# Patient Record
Sex: Male | Born: 1955 | Race: White | Hispanic: No | Marital: Married
Health system: Southern US, Community
[De-identification: ages and names within clinical notes are randomized; demographics above are authoritative.]

---

## 2011-02-12 ENCOUNTER — Emergency Department: Payer: Self-pay | Admitting: Unknown Physician Specialty

## 2011-04-05 ENCOUNTER — Inpatient Hospital Stay: Payer: Self-pay | Admitting: General Practice

## 2013-12-16 ENCOUNTER — Inpatient Hospital Stay: Payer: Self-pay | Admitting: Family Medicine

## 2013-12-16 LAB — CBC WITH DIFFERENTIAL/PLATELET
BASOS ABS: 0.1 10*3/uL (ref 0.0–0.1)
BASOS PCT: 0.5 %
EOS ABS: 0.3 10*3/uL (ref 0.0–0.7)
Eosinophil %: 1.8 %
HCT: 41.3 % (ref 40.0–52.0)
HGB: 14.1 g/dL (ref 13.0–18.0)
LYMPHS ABS: 0.8 10*3/uL — AB (ref 1.0–3.6)
Lymphocyte %: 5.2 %
MCH: 30 pg (ref 26.0–34.0)
MCHC: 34.1 g/dL (ref 32.0–36.0)
MCV: 88 fL (ref 80–100)
MONO ABS: 1.1 x10 3/mm — AB (ref 0.2–1.0)
MONOS PCT: 6.7 %
NEUTROS ABS: 13.8 10*3/uL — AB (ref 1.4–6.5)
Neutrophil %: 85.8 %
Platelet: 451 10*3/uL — ABNORMAL HIGH (ref 150–440)
RBC: 4.69 10*6/uL (ref 4.40–5.90)
RDW: 14 % (ref 11.5–14.5)
WBC: 16.1 10*3/uL — AB (ref 3.8–10.6)

## 2013-12-16 LAB — URINALYSIS, COMPLETE
BACTERIA: NONE SEEN
BILIRUBIN, UR: NEGATIVE
BLOOD: NEGATIVE
Glucose,UR: NEGATIVE mg/dL (ref 0–75)
LEUKOCYTE ESTERASE: NEGATIVE
NITRITE: NEGATIVE
Ph: 7 (ref 4.5–8.0)
Protein: NEGATIVE
RBC,UR: 1 /HPF (ref 0–5)
Specific Gravity: 1.013 (ref 1.003–1.030)
Squamous Epithelial: NONE SEEN
WBC UR: 1 /HPF (ref 0–5)

## 2013-12-16 LAB — COMPREHENSIVE METABOLIC PANEL
ALT: 13 U/L (ref 12–78)
Albumin: 2.4 g/dL — ABNORMAL LOW (ref 3.4–5.0)
Alkaline Phosphatase: 74 U/L
Anion Gap: 6 — ABNORMAL LOW (ref 7–16)
BILIRUBIN TOTAL: 0.3 mg/dL (ref 0.2–1.0)
BUN: 6 mg/dL — AB (ref 7–18)
CO2: 27 mmol/L (ref 21–32)
Calcium, Total: 9.1 mg/dL (ref 8.5–10.1)
Chloride: 100 mmol/L (ref 98–107)
Creatinine: 0.79 mg/dL (ref 0.60–1.30)
EGFR (Non-African Amer.): >60
Glucose: 153 mg/dL — ABNORMAL HIGH (ref 65–99)
Osmolality: 267 (ref 275–301)
Potassium: 4.6 mmol/L (ref 3.5–5.1)
SGOT(AST): 14 U/L — ABNORMAL LOW (ref 15–37)
Sodium: 133 mmol/L — ABNORMAL LOW (ref 136–145)
Total Protein: 7.1 g/dL (ref 6.4–8.2)

## 2013-12-17 LAB — BASIC METABOLIC PANEL
Anion Gap: 6 — ABNORMAL LOW (ref 7–16)
BUN: 6 mg/dL — ABNORMAL LOW (ref 7–18)
CHLORIDE: 106 mmol/L (ref 98–107)
CO2: 27 mmol/L (ref 21–32)
Calcium, Total: 8.3 mg/dL — ABNORMAL LOW (ref 8.5–10.1)
Creatinine: 0.68 mg/dL (ref 0.60–1.30)
EGFR (African American): >60
Glucose: 113 mg/dL — ABNORMAL HIGH (ref 65–99)
OSMOLALITY: 276 (ref 275–301)
Potassium: 3.8 mmol/L (ref 3.5–5.1)
SODIUM: 139 mmol/L (ref 136–145)

## 2013-12-17 LAB — CBC WITH DIFFERENTIAL/PLATELET
BASOS ABS: 0.1 10*3/uL (ref 0.0–0.1)
Basophil %: 0.6 %
EOS PCT: 2.8 %
Eosinophil #: 0.3 10*3/uL (ref 0.0–0.7)
HCT: 39.4 % — AB (ref 40.0–52.0)
HGB: 13.1 g/dL (ref 13.0–18.0)
Lymphocyte #: 1.2 10*3/uL (ref 1.0–3.6)
Lymphocyte %: 10.7 %
MCH: 29.1 pg (ref 26.0–34.0)
MCHC: 33.3 g/dL (ref 32.0–36.0)
MCV: 88 fL (ref 80–100)
Monocyte #: 0.9 x10 3/mm (ref 0.2–1.0)
Monocyte %: 8.5 %
Neutrophil #: 8.5 10*3/uL — ABNORMAL HIGH (ref 1.4–6.5)
Neutrophil %: 77.4 %
Platelet: 413 10*3/uL (ref 150–440)
RBC: 4.5 10*6/uL (ref 4.40–5.90)
RDW: 14 % (ref 11.5–14.5)
WBC: 11 10*3/uL — ABNORMAL HIGH (ref 3.8–10.6)

## 2013-12-17 LAB — HEMOGLOBIN A1C: HEMOGLOBIN A1C: 7.6 % — AB (ref 4.2–6.3)

## 2013-12-18 LAB — CREATININE, SERUM
Creatinine: 1.07 mg/dL (ref 0.60–1.30)
EGFR (African American): >60
EGFR (Non-African Amer.): >60

## 2013-12-18 LAB — VANCOMYCIN, TROUGH: Vancomycin, Trough: 18 ug/mL (ref 10–20)

## 2013-12-18 LAB — URINE CULTURE

## 2013-12-19 LAB — CREATININE, SERUM
Creatinine: 1.27 mg/dL (ref 0.60–1.30)
EGFR (African American): >60
EGFR (Non-African Amer.): >60

## 2013-12-19 LAB — VANCOMYCIN, TROUGH: Vancomycin, Trough: 23 ug/mL (ref 10–20)

## 2013-12-20 LAB — CREATININE, SERUM
CREATININE: 1.18 mg/dL (ref 0.60–1.30)
EGFR (African American): >60

## 2013-12-21 LAB — CREATININE, SERUM
Creatinine: 1.09 mg/dL (ref 0.60–1.30)
EGFR (African American): >60
EGFR (Non-African Amer.): >60

## 2013-12-21 LAB — CULTURE, BLOOD (SINGLE)

## 2013-12-22 LAB — BASIC METABOLIC PANEL
Anion Gap: 8 (ref 7–16)
BUN: 19 mg/dL — ABNORMAL HIGH (ref 7–18)
CHLORIDE: 110 mmol/L — AB (ref 98–107)
CO2: 27 mmol/L (ref 21–32)
CREATININE: 1.09 mg/dL (ref 0.60–1.30)
Calcium, Total: 8.8 mg/dL (ref 8.5–10.1)
EGFR (African American): >60
GLUCOSE: 108 mg/dL — AB (ref 65–99)
OSMOLALITY: 291 (ref 275–301)
POTASSIUM: 4.1 mmol/L (ref 3.5–5.1)
SODIUM: 145 mmol/L (ref 136–145)

## 2013-12-22 LAB — VANCOMYCIN, TROUGH: VANCOMYCIN, TROUGH: 19 ug/mL (ref 10–20)

## 2013-12-22 LAB — WOUND CULTURE

## 2013-12-23 LAB — CREATININE, SERUM
Creatinine: 1.22 mg/dL (ref 0.60–1.30)
EGFR (Non-African Amer.): >60

## 2013-12-23 LAB — VANCOMYCIN, TROUGH: VANCOMYCIN, TROUGH: 19 ug/mL (ref 10–20)

## 2014-02-10 ENCOUNTER — Encounter: Payer: Self-pay | Admitting: Surgery

## 2014-03-02 ENCOUNTER — Encounter: Payer: Self-pay | Admitting: Surgery

## 2014-05-10 ENCOUNTER — Encounter: Payer: Self-pay | Admitting: Surgery

## 2014-06-01 ENCOUNTER — Encounter: Payer: Self-pay | Admitting: Surgery

## 2014-07-19 ENCOUNTER — Encounter: Payer: Self-pay | Admitting: General Surgery

## 2014-08-02 ENCOUNTER — Encounter: Payer: Self-pay | Admitting: General Surgery

## 2015-03-25 NOTE — Op Note (Signed)
PATIENT NAME:  [name], Macaulay MR#:  [id number] DATE OF BIRTH:  Dec 28, 1955  DATE OF PROCEDURE:  12/18/2013  PREOPERATIVE DIAGNOSIS: Buttock abscess/decubitus with necrosis.   POSTOPERATIVE DIAGNOSIS:  Buttock abscess/decubitus with necrosis.   PROCEDURE PERFORMED:  1.  Further debridement of necrotic soft tissue left buttock wound, 7 x 16 x 1 cm,  right buttock  11 x 1 x 1 cm.  2.  Placement of negative pressure wound VAC therapy.   ESTIMATED BLOOD LOSS: 5 mL.   COMPLICATIONS: None.   SPECIMENS: None.   ANESTHESIA: General.   INDICATION FOR SURGERY: Mr. Andrew Flowers is a pleasant 59 year old male who has large buttock abscess with necrosis that had previously been debrided. He was brought to the operating room suite for further debridement and placement of wound VAC.   DETAILS OF PROCEDURE: Informed consent was obtained.  Mr. Andrew Flowers was then brought to the operating room suite. He was  induced and endotracheal tube was placed, general anesthesia was administered. He was then placed in right lateral decubitus.  His buttock wound dressing was taken down and it was draped and prepped in standard surgical fashion. A timeout was then performed correctly identifying patient name, operative site and procedure performed. The wound was then examined and debrided of all further necrotic tissue. It was mostly done to subcutaneous fat. I was happy with the quality wound bed.  I then placed a large wound VAC sponge over his wound. I difficulty with a seal as it is extremely close to his anus, but was able to get sufficient seal. I did place a piece of  sponge, connecting his wound VAC to his left thigh to allow the track bed to be placed in the left thigh and thus prevent him from sitting directly on it. Following placement of wound VAC, he was laid back supine. He was extubated and brought to the postanesthesia care unit. There were no immediate complications. Needle, sponge, and instrument counts were correct at the  end of the procedure.    ____________________________ Si Raiderhristopher A. Emmaleah Meroney, MD cal:cc D: 12/19/2013 10:26:42 ET T: 12/19/2013 22:12:54 ET JOB#: [id number]  cc: Cristal Deerhristopher A. Iziah Cates, MD, <Dictator> Jarvis NewcomerHRISTOPHER A Ly Wass MD ELECTRONICALLY SIGNED 12/22/2013 13:53
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
## 2015-03-25 NOTE — Op Note (Signed)
PATIENT NAME:  Andrew Flowers, Andrew Flowers MR#:  [id number] DATE OF BIRTH:  [birth year]  DATE OF PROCEDURE:  12/22/2013  PREOPERATIVE DIAGNOSIS: Sacral decubitus ulcer.  POSTOPERATIVE DIAGNOSIS: Sacral decubitus ulcer.  PROCEDURES PERFORMED: Examination under anesthesia, removal of existing wound VAC, tangential excision of necrotic fat measuring 3 x 3 square centimeters, sharp excisional debridement.   SURGEON: Natale LayMark Almira Phetteplace, M.D.   ASSISTANT: None.   ANESTHESIA: General oroendotracheal.   FINDINGS: Stage II decubitus ulcer intimately involved at the anal margin. Application of wound VAC was impossible secondary to body habitus as well as proximity to anal region.   SPECIMENS: None.   ESTIMATED BLOOD LOSS: Minimal.   DESCRIPTION OF PROCEDURE: With informed consent, supine position, general endotracheal anesthesia. The patient was then turned in right lateral decubitus position with the help of the bean bag and secure strapping of the patient to the table.   The existing wound VAC was removed. The wound measured approximately 10 x 14 cm. It was complex in nature. There was no evidence of active infection or further tunneling. A patch of necrotic fat was excised with electrocautery. The specimen was discarded. Attempt at wound VAC placement again was not feasible due to proximity to the anus. As such a dry dressing was applied. The patient was then returned supine, extubated, and taken to the recovery room in stable and satisfactory condition. ____________________________ Redge GainerMark A. Egbert GaribaldiBird, MD mab:sb D: 12/23/2013 07:37:26 ET T: 12/23/2013 08:07:26 ET JOB#: [id number]  cc: Loraine LericheMark A. Egbert GaribaldiBird, MD, <Dictator> Raynald KempMARK A Goldy Calandra MD ELECTRONICALLY SIGNED 12/28/2013 8:23

## 2015-03-25 NOTE — Op Note (Signed)
PATIENT NAME:  [name], Jobani MR#:  [id number] DATE OF BIRTH:  03/29/1956  DATE OF PROCEDURE:  12/20/2013  PREOPERATIVE DIAGNOSES:  1.  MRSA bacteremia.  2.  Necrotizing sacral decubitus.  POSTOPERATIVE DIAGNOSES:  1.  MRSA bacteremia.  2.  Necrotizing sacral decubitus.  PROCEDURES: 1.  Right basilic vein.  2.  Double-lumen PICC line, right arm.   SURGEON: Renford DillsGregory G. Lattie Cervi, M.D.   ANESTHESIA: Local.   ESTIMATED BLOOD LOSS: Minimal.   INDICATION:  Requiring IV antibiotics greater than 5 days.   DESCRIPTION OF PROCEDURE: The patient's right arm was sterilely prepped and draped, and a sterile surgical field was created. The basilic vein was accessed under direct ultrasound guidance without difficulty with a micropuncture needle and permanent image was recorded. 0.018 wire was then placed into the superior vena cava. Peel-away sheath was placed over the wire. A single lumen peripherally inserted central venous catheter was then placed over the wire and the wire and peel-away sheath were removed. The catheter tip was placed into the superior vena cava and was secured at the skin at 39 cm with a sterile dressing. The catheter withdrew blood well and flushed easily with heparinized saline. The patient tolerated procedure well.   ____________________________ Renford DillsGregory G. Linn Clavin, MD ggs:mr D: 12/21/2013 17:30:58 ET T: 12/21/2013 19:31:56 ET JOB#: [id number]  cc: Renford DillsGregory G. Dalayza Zambrana, MD, <Dictator> Renford DillsGREGORY G Nikitia Asbill MD ELECTRONICALLY SIGNED 01/12/2014 13:24

## 2015-03-25 NOTE — Op Note (Signed)
PATIENT NAME:  [name], [name]:  [id number] DATE OF BIRTH:  03/06/1956  DATE OF PROCEDURE:  12/17/2013  PREOPERATIVE DIAGNOSIS: History of gluteal abscesses/sacral decubitus ulcer.  POSTOPERATIVE DIAGNOSIS: History of gluteal abscesses/sacral decubitus ulcer.  PROCEDURE PERFORMED:  1.  Debridement of left buttocks abscess/ulcer/necrotic tissue, 15 x 7 x 1.  2.  Debridement of right buttocks abscess 1 x 1 x 1.   HISTORY OF PRESENT ILLNESS: Mr. Andrew Flowers is a pleasant 59 year old male with a recent history of multiple buttock abscesses, which became quite large, coalesced, are now purulent with significant necrosis and eschar. He was admitted from the ED for this infection and brought to the operating room for surgical management.   DETAILS OF PROCEDURE: As follows: Informed consent was obtained. Mr. Andrew Flowers was brought to the operating room suite. He was laid supine in the left lateral decubitus position. After endotracheal tube was placed, his buttocks was prepped and draped in standard surgical fashion. A timeout was then performed correctly identifying patient name, operative site, and procedure to be performed. I then proceeded to remove the eschar and infected tissue to healthy bleeding tissue on his left buttocks and midline wounds. I then debrided a similar abscess on his right buttocks, which was approximately 1 x 1. Hemostasis was obtained with Bovie electrocautery. Once I was satisfied with resection, I placed Betadine-soaked Kerlix on the wound and placed sterile dressings over the wound. These were then taped in place. The patient was then awoken, extubated, and brought to the postanesthesia care unit. There were no immediate complications. Needle, sponge, and instrument counts were correct at the end of the procedure.   ____________________________ Si Raiderhristopher A. Brooks Kinnan, MD cal:jcm D: 12/18/2013 13:01:54 ET T: 12/18/2013 21:23:28 ET JOB#: [id number]  cc: Cristal Deerhristopher A. Katlyne Nishida, MD,  <Dictator> Jarvis NewcomerHRISTOPHER A Ginette Bradway MD ELECTRONICALLY SIGNED 12/22/2013 13:53

## 2015-03-25 NOTE — Consult Note (Signed)
Brief Consult Note: Diagnosis: lateral ankle decubitus wouind.   Patient was seen by consultant.   Orders entered.   Comments: will have wound care see him, will need new AFO to prevent recurrence.  Electronic Signatures: Leitha SchullerMenz, Delanda Bulluck J (MD)  (Signed 16-Jan-15 12:45)  Authored: Brief Consult Note   Last Updated: 16-Jan-15 12:45 by Leitha SchullerMenz, Jenicka Coxe J (MD)

## 2015-03-25 NOTE — Consult Note (Signed)
PATIENT NAME:  Andrew Flowers, [name] MR#:  [id number] DATE OF BIRTH:  [birth year]  DATE OF CONSULTATION:  12/19/2013  CONSULTING PHYSICIAN:  Leitha SchullerMichael J. Briggs Edelen, MD  REASON FOR CONSULTATION: Left lateral ankle ulcer.   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 He has no active extension of the foot.  He reports that his last braces are about 60eight years old. He had a left tibia fracture that was treated by Dr. Ernest PineHooten and he had prior droplock  AFOs ordered and they have not been changed for many years. He reports that he had significant edema at times and the brace presses on his leg. He is not able to feel this. He is currently admitted with sepsis.   On examination, he has an approximately 3 cm area of open superficial ulceration with granulation tissue underneath. It is not foul smelling. There is serosanguineous drainage.   CLINICAL IMPRESSION: Decubitus ulcer lateral ankle secondary to pressure.   RECOMMENDATION: Wound care consult for addressing this lateral ankle wound. Once it is healed he will need to come in and see Dr. Ernest PineHooten or his P.A. Van ClinesJon Wolfe to order new braces and we should be able to prevent this from recurring in the future.  ____________________________ Leitha SchullerMichael J. Jakeim Sedore, MD mjm:sg D: 12/19/2013 09:05:44 ET T: 12/19/2013 09:39:18 ET JOB#: [id number]  cc: Leitha SchullerMichael J. Rozell Theiler, MD, <Dictator> Leitha SchullerMICHAEL J Jeison Delpilar MD ELECTRONICALLY SIGNED 12/19/2013 14:08

## 2015-03-25 NOTE — Discharge Summary (Signed)
PATIENT NAME:  Andrew Flowers, Andrew Flowers MR#:  [id number] DATE OF BIRTH:  September 03, 1956  DATE OF ADMISSION:  12/16/2013 DATE OF DISCHARGE:  12/23/2013   DISPOSITION: Home with home health.   FINAL DIAGNOSES:  1. Wound infection with methicillin-resistant Staphylococcus aureus, Pseudomonas and bacteroids.  2. Necrotizing infected decubitus ulcer.  3. Diabetes. 4. Hypertension.  5. Paraplegia.   DISPOSITION: Again, home with home health.   MEDICATIONS AT DISCHARGE:  1. Tramadol 50 mg 1 to 2 every 4 to 6 hours as needed for pain.  2. Metoprolol 25 mg twice daily.  3. Glipizide 5 mg twice daily.  4. Metformin 500 mg twice daily.  5. Aspirin 325 mg once a day. 6. Metronidazole 500 mg every 8 hours for 7 days.  7. Sertraline 50 mg once a day.  8. Alprazolam 0.25 mg twice daily.  9. Vancomycin 1500 mg every 18 hours for 7 days. 10. Levaquin 750 mg once a day for 7 days. 11. Juven for wound healing.  12. Percocet 10/325 mg every 4 hours as needed for pain.   OTHER RECOMMENDATIONS:  [facility]  2. Follow up with Dr. Egbert GaribaldiBird in the next 2 weeks.  3. Antibiotics IV, PICC line care per protocol.   HOSPITAL COURSE: This is a very nice 59 year old gentleman who has history of quadriplegia, who was sent to the Emergency Department by Dr. Lacie ScottsNiemeyer due to an infected decubitus ulcer. The patient had significant sepsis, had significant drainage on the boil for 8 to 9 days, associated with fever and chills for 5 or 6 days. The patient was evaluated by surgery, who recommend for debridement. The patient had a debridement of necrotic soft tissue on the left buttock, 7 x 16 x 1 cm. No bone involvement. Significant bacterial growth with Pseudomonas, MRSA, and bacteroids on the wound, for which he was covered with vancomycin, Levaquin and metronidazole. So, the patient underwent a PICC line placement. He had Dr. Gilda CreaseSchnier to do a right basilic vein double-lumen PICC, and antibiotics were  prescribed. The patient needs to have followup with Dr. Egbert GaribaldiBird. Wound VAC was attempted to be placed, but it was not possible due to the size and the position of the wound. The patient is ready to go home today. He is afebrile. He has been in really good condition. His white count was 11,000 the last time it was checked. The patient again has been afebrile. His blood pressure has remained stable in the 140s over 80s, and he is not longer tachycardic at 66.   TIME SPENT: I spent about 50 minutes discharging this patient today.   ____________________________ Felipa Furnaceoberto Sanchez Gutierrez, MD rsg:lb D: 12/23/2013 13:40:19 ET T: 12/23/2013 14:39:33 ET JOB#: [id number]  cc: Felipa Furnaceoberto Sanchez Gutierrez, MD, <Dictator> Mark A. Egbert GaribaldiBird, MD Meindert A. Lacie ScottsNiemeyer, MD Regan RakersOBERTO Juanda ChanceSANCHEZ GUTIERRE MD ELECTRONICALLY SIGNED 12/26/2013 8:30
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
## 2015-07-29 IMAGING — XA IR VASCULAR PROCEDURE
1 series · 1 of 1 positions shown · non-contrast
Comparison: none

[Series 1: single · 1 of 1 slices shown]
[im 1/1]
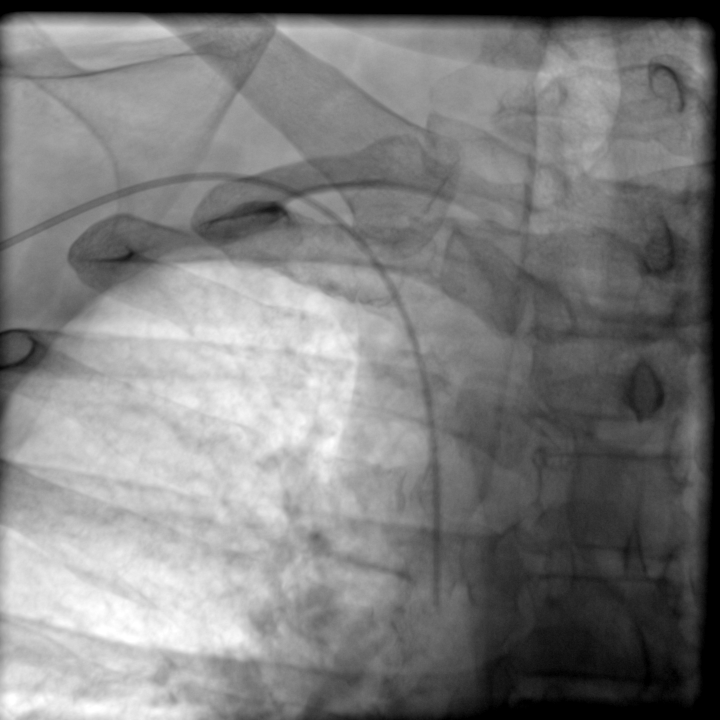

[1 of 1 positions shown; findings below may reference images not displayed]

IMAGES IMPORTED FROM THE [employer] WORKFLOW SYSTEM
NO DICTATION FOR STUDY
